# Patient Record
Sex: Male | Born: 1965 | Race: White | Hispanic: No
Health system: Southern US, Community
[De-identification: ages and names within clinical notes are randomized; demographics above are authoritative.]

---

## 2009-05-31 ENCOUNTER — Ambulatory Visit (HOSPITAL_COMMUNITY): Admission: RE | Admit: 2009-05-31 | Discharge: 2009-05-31 | Payer: Self-pay | Admitting: Neurosurgery

## 2009-06-19 ENCOUNTER — Ambulatory Visit: Payer: Self-pay | Admitting: Vascular Surgery

## 2009-08-15 ENCOUNTER — Ambulatory Visit (HOSPITAL_COMMUNITY): Admission: RE | Admit: 2009-08-15 | Discharge: 2009-08-16 | Payer: Self-pay | Admitting: Neurosurgery

## 2009-09-13 ENCOUNTER — Ambulatory Visit: Payer: Self-pay | Admitting: Vascular Surgery

## 2009-09-24 ENCOUNTER — Encounter: Admission: RE | Admit: 2009-09-24 | Discharge: 2009-09-24 | Payer: Self-pay | Admitting: Neurosurgery

## 2009-11-11 ENCOUNTER — Encounter: Admission: RE | Admit: 2009-11-11 | Discharge: 2009-11-11 | Payer: Self-pay | Admitting: Neurosurgery

## 2010-04-13 ENCOUNTER — Encounter: Payer: Self-pay | Admitting: Neurosurgery

## 2010-06-09 LAB — BASIC METABOLIC PANEL
CO2: 29 mEq/L (ref 19–32)
Calcium: 9.4 mg/dL (ref 8.4–10.5)
Chloride: 109 mEq/L (ref 96–112)
Creatinine, Ser: 1.34 mg/dL (ref 0.4–1.5)
GFR calc non Af Amer: 58 mL/min — ABNORMAL LOW (ref >60)
Potassium: 3.9 mEq/L (ref 3.5–5.1)
Sodium: 141 mEq/L (ref 135–145)

## 2010-06-09 LAB — TYPE AND SCREEN: Antibody Screen: NEGATIVE

## 2010-08-05 NOTE — Procedures (Signed)
LOWER EXTREMITY VENOUS REFLUX EXAM   INDICATION:  Right lower extremity bulging varicosities.   EXAM:  Using color-flow imaging and pulse Doppler spectral analysis, the  right common femoral, superficial femoral, popliteal, posterior tibial,  greater and lesser saphenous veins are evaluated.  There is mild  evidence suggesting deep venous insufficiency in the right lower  extremity.   The right saphenofemoral junction is not competent with reflux of  greater than 500 milliseconds. The right GSV is not competent with  reflux of greater than 500 milliseconds with the caliber as described  below.   The right proximal short saphenous vein demonstrates competency.   GSV Diameter (used if found to be incompetent only)                                            Right    Left  Proximal Greater Saphenous Vein           1.26 cm  cm  Proximal-to-mid-thigh                     0.86 cm  cm  Mid thigh                                 0.22 cm  cm  Mid-distal thigh                          cm       cm  Distal thigh                              0.77 cm  cm  Knee                                      0.80 cm  cm   IMPRESSION:  1. 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  2. The right greater saphenous vein is not aneurysmal.  3. The right greater saphenous vein is not tortuous.  4. The deep venous system is competent.  5. The right lesser saphenous vein is competent.  6. One incompetent perforator vein is identified in the medial mid      calf measuring 0.33 cm and contributing to varicosities.   ___________________________________________  Larina Earthly, M.D.   CJ/MEDQ  D:  06/19/2009  T:  06/19/2009  Job:  [id number]

## 2010-08-05 NOTE — Consult Note (Signed)
NEW PATIENT CONSULTATION   Mike Conway, Mike Conway  DOB:  1965-09-25                                       06/19/2009  [id number]#:[id number]   The patient presents today for evaluation of right leg venous  varicosities.  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  He does not have a history of deep  venous thrombosis or superficial thrombophlebitis or bleeding.  He does  have aching sensation and swelling in his right calf versus his left  calf after prolonged sitting and standing.   PAST MEDICAL HISTORY:  Is negative for diabetes, hypertension or cardiac  disease.   FAMILY HISTORY:  Is negative for premature atherosclerotic disease.   SOCIAL HISTORY:  He is married with two children.  He is not retired.  He does not smoke or drink alcohol.   REVIEW OF SYSTEMS:  Negative for weight loss or weight gain.  He weighs  115 pounds, is 5 feet 9 inches tall.  CARDIAC:  Negative.  PULMONARY:  Negative.  GI:  Positive for constipation and blood in the stool in the past.  GU:  Negative.  VASCULAR:  Significant for pain in his legs with standing and walking.  NEUROLOGIC:  Without dizziness, blackouts or seizures.  MUSCULOSKELETAL:  Positive for arthritis, joint pain.  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  PSYCHIATRIC:  Negative.  ENT:  Negative.  HEMATOLOGIC:  Without bleeding discomfort, bleeding difficulties or  clotting disorders.  SKIN:  Without ulcers or rashes.   PHYSICAL EXAM:  General:  A well-developed, well-nourished white male  appearing stated age, in no acute distress.  Vital signs:  Blood  pressure is 123/87, pulse 62, respirations 16.  HEENT:  Normal.  Chest:  Clear.  Abdomen:  Soft, nontender.  Musculoskeletal:  No major  deformities or cyanosis.  He does have  incisions from a recent foot  surgery on his right great toe.  Neurological:  No focal weakness or  paresthesias.  Skin:  Without ulcers or rashes.  He does have 2+ radial  and 2+ dorsalis pedis pulses bilaterally.  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   He underwent noninvasive vascular laboratory studies in our office today  and I have reviewed this with the patient.  This shows reflux throughout  his right great saphenous vein and also an anterior branch off of the  saphenofemoral junction.  These are feeding into these very large  varicosities.  I discussed the significance of this with his valvular  incompetence with the patient.  He is fitted today with graduated  compression garments 20-30 mmHg and is instructed on their use.  He also  will continue with elevation and Advil for discomfort.  We will see him  back in 3 months to determine if conservative treatment is effective.  He would be an excellent candidate for laser ablation of his great  saphenous vein and treatment similarly with stab phlebectomy for his  tributary varicosities.  We will discuss this after seeing him again in  3 months.     Larina Earthly, M.D.  Electronically  Signed   TFE/MEDQ  D:  06/19/2009  T:  06/20/2009  Job:  [id number]

## 2010-08-05 NOTE — Assessment & Plan Note (Signed)
OFFICE VISIT   WARWICK, Mike Conway  DOB:  [birth year]                                       09/13/2009  [id number]#:[id number]   The patient presents today for further evaluation of venous hypertension  in his right leg.  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  He reports that  he has continued to have severe discomfort in the right leg,  specifically over the varicosities which are quite large in his thigh  and calf, and also an achy sensation in his leg and calf in general.  He  has worn graduated compression garments but reports this has given him  no benefit in symptom relief.  He has had difficulty with his routine  activities around the home due to leg pain.  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  I have re-imaged his leg with ultrasound.  This does show  incompetence throughout his enlarged saphenous vein feeding into these  large varicosities.  I have recommended that we proceed with right leg  laser ablations and stab phlebectomy of multiple tributary varicosities  for symptom relief.  He understands this is an outpatient procedure  under local anesthesia.  He wishes to proceed as soon as possible     Larina Earthly, M.D.  Electronically Signed   TFE/MEDQ  D:  09/13/2009  T:  09/13/2009  Job:  [id number]

## 2011-03-10 IMAGING — RF DG LUMBAR SPINE 2-3V
1 series · 2 of 2 positions shown · non-contrast
Comparison: CT lumbar myelogram 05/31/2009.

Fluoroscopy time of 1.5 minutes was utilized.

CLINICAL DATA: 44-year-old male undergoing L4-L5 spine surgery.

LUMBAR SPINE - 2-3 VIEW

[Series 1: run · 2 of 2 slices shown]
[im 1/2]
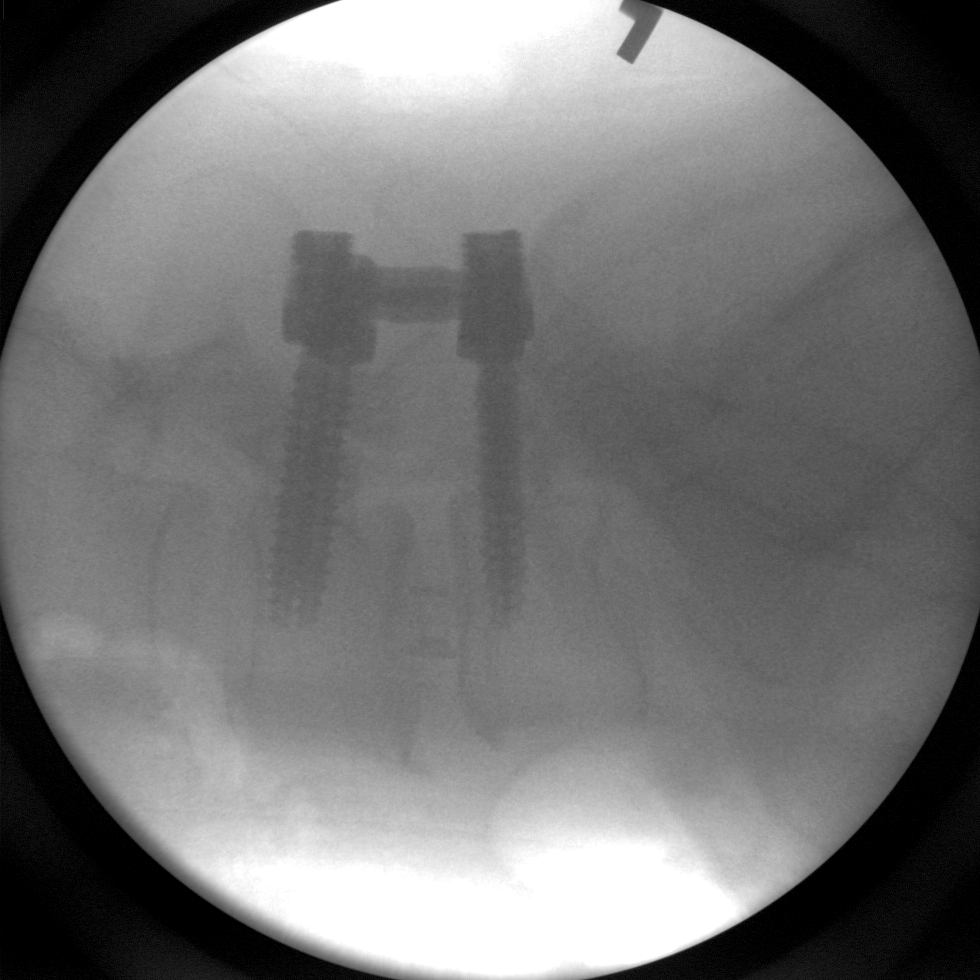
[im 2/2]
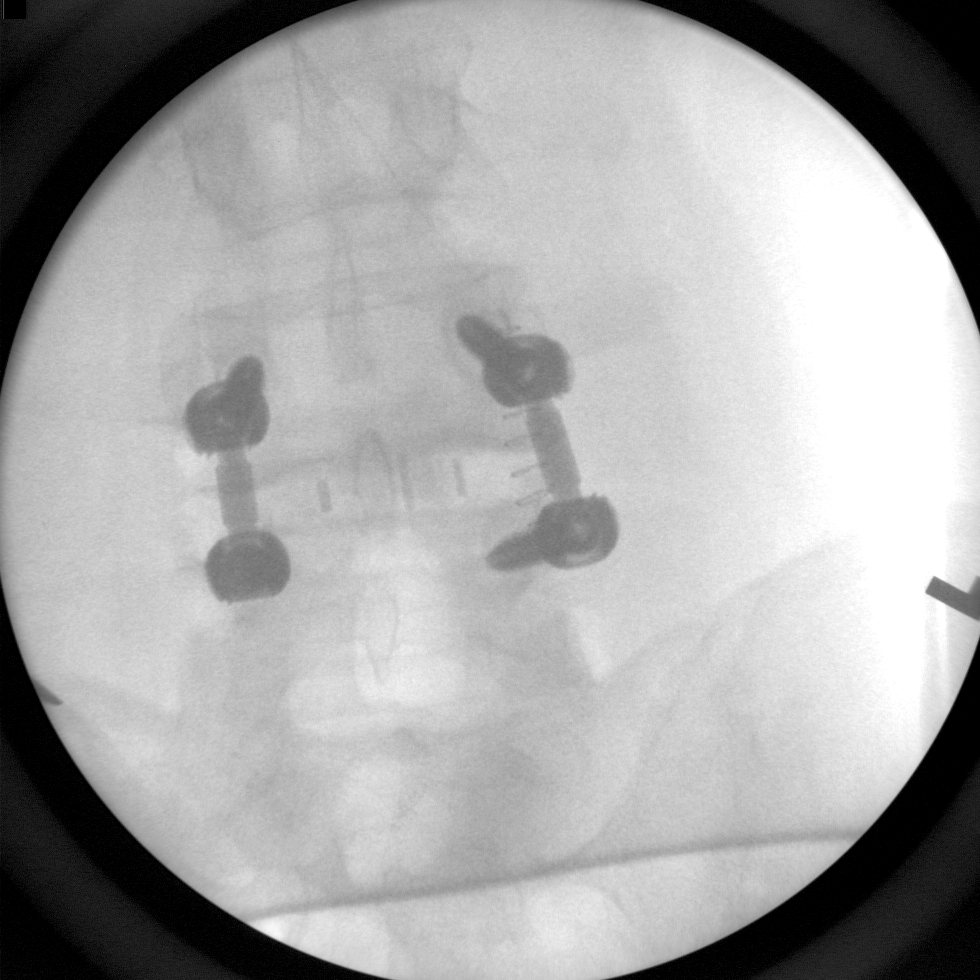

[2 of 2 positions shown; findings below may reference images not displayed]

FINDINGS: Intraoperative fluoroscopic spot view of L4-L5 in the
anterior posterior projection.  Bilateral transpedicular hardware
with connecting rods are in place.  Interbody implant also in
place.
IMPRESSION: L4-L5 transpedicular and interbody hardware placed.

## 2016-05-12 ENCOUNTER — Other Ambulatory Visit: Payer: Self-pay | Admitting: Neurosurgery

## 2016-05-12 DIAGNOSIS — M5416 Radiculopathy, lumbar region: Secondary | ICD-10-CM

## 2016-05-15 ENCOUNTER — Ambulatory Visit
Admission: RE | Admit: 2016-05-15 | Discharge: 2016-05-15 | Disposition: A | Discharge status: Home / Self Care | Payer: BLUE CROSS/BLUE SHIELD | Source: Ambulatory Visit | Attending: Neurosurgery | Admitting: Neurosurgery

## 2016-05-15 DIAGNOSIS — M5416 Radiculopathy, lumbar region: Secondary | ICD-10-CM

## 2016-05-15 MED ORDER — DIAZEPAM 5 MG PO TABS
10.0000 mg | ORAL_TABLET | Freq: Once | ORAL | Status: AC
  Administered 2016-05-15: 5 mg via ORAL

## 2016-05-15 MED ORDER — IOPAMIDOL (ISOVUE-M 200) INJECTION 41%
15.0000 mL | Freq: Once | INTRAMUSCULAR | Status: AC
Start: 2016-05-15 — End: 2016-05-15
  Administered 2016-05-15: 15 mL via INTRATHECAL

## 2016-05-15 NOTE — Progress Notes (Signed)
Pt states he has been off Tramadol since Tuesday.

## 2016-05-15 NOTE — Discharge Instructions (Signed)
Myelogram Discharge Instructions  1. Go home and rest quietly for the next 24 hours.  It is important to lie flat for the next 24 hours.  Get up only to go to the restroom.  You may lie in the bed or on a couch on your back, your stomach, your left side or your right side.  You may have one pillow under your head.  You may have pillows between your knees while you are on your side or under your knees while you are on your back.  2. DO NOT drive today.  Recline the seat as far back as it will go, while still wearing your seat belt, on the way home.  3. You may get up to go to the bathroom as needed.  You may sit up for 10 minutes to eat.  You may resume your normal diet and medications unless otherwise indicated.  Drink lots of extra fluids today and tomorrow.  4. The incidence of headache, nausea, or vomiting is about 5% (one in 20 patients).  If you develop a headache, lie flat and drink plenty of fluids until the headache goes away.  Caffeinated beverages may be helpful.  If you develop severe nausea and vomiting or a headache that does not go away with flat bed rest, call [phone].  5. You may resume normal activities after your 24 hours of bed rest is over; however, do not exert yourself strongly or do any heavy lifting tomorrow. If when you get up you have a headache when standing, go back to bed and force fluids for another 24 hours.  6. Call your physician for a follow-up appointment.  The results of your myelogram will be sent directly to your physician by the following day.  7. If you have any questions or if complications develop after you arrive home, please call [phone].  Discharge instructions have been explained to the patient.  The patient, or the person responsible for the patient, fully understands these instructions.       May resume Tramadol on Feb. 24, 2018, after 1:00 pm.

## 2016-05-20 NOTE — Progress Notes (Signed)
Pt states he did very well and that he will follow up with his dr. on Friday.
